# Patient Record
Sex: Male | Born: 1970 | Race: Black or African American | Hispanic: No | Marital: Married | Smoking: Current some day smoker
Health system: Southern US, Community
[De-identification: ages and names within clinical notes are randomized; demographics above are authoritative.]

## PROBLEM LIST (undated history)

## (undated) DIAGNOSIS — M5126 Other intervertebral disc displacement, lumbar region: Secondary | ICD-10-CM

## (undated) DIAGNOSIS — M549 Dorsalgia, unspecified: Secondary | ICD-10-CM

## (undated) DIAGNOSIS — I10 Essential (primary) hypertension: Secondary | ICD-10-CM

---

## 1998-12-22 ENCOUNTER — Emergency Department (HOSPITAL_COMMUNITY): Admission: EM | Admit: 1998-12-22 | Discharge: 1998-12-22 | Payer: Self-pay | Admitting: Emergency Medicine

## 1999-08-05 ENCOUNTER — Encounter: Payer: Self-pay | Admitting: Emergency Medicine

## 1999-08-05 ENCOUNTER — Emergency Department (HOSPITAL_COMMUNITY): Admission: EM | Admit: 1999-08-05 | Discharge: 1999-08-05 | Payer: Self-pay | Admitting: Emergency Medicine

## 2001-01-28 ENCOUNTER — Emergency Department (HOSPITAL_COMMUNITY): Admission: EM | Admit: 2001-01-28 | Discharge: 2001-01-28 | Payer: Self-pay | Admitting: Emergency Medicine

## 2004-12-03 ENCOUNTER — Emergency Department (HOSPITAL_COMMUNITY): Admission: EM | Admit: 2004-12-03 | Discharge: 2004-12-03 | Payer: Self-pay | Admitting: Emergency Medicine

## 2010-12-11 ENCOUNTER — Emergency Department (HOSPITAL_COMMUNITY)
Admission: EM | Admit: 2010-12-11 | Discharge: 2010-12-12 | Disposition: A | Discharge status: Home / Self Care | Payer: Self-pay | Attending: Emergency Medicine | Admitting: Emergency Medicine

## 2010-12-11 DIAGNOSIS — M25569 Pain in unspecified knee: Secondary | ICD-10-CM | POA: Insufficient documentation

## 2010-12-26 ENCOUNTER — Emergency Department (HOSPITAL_COMMUNITY): Payer: Self-pay

## 2010-12-26 ENCOUNTER — Emergency Department (HOSPITAL_COMMUNITY)
Admission: EM | Admit: 2010-12-26 | Discharge: 2010-12-26 | Disposition: A | Discharge status: Home / Self Care | Payer: Self-pay | Attending: Emergency Medicine | Admitting: Emergency Medicine

## 2010-12-26 DIAGNOSIS — M25569 Pain in unspecified knee: Secondary | ICD-10-CM | POA: Insufficient documentation

## 2010-12-26 DIAGNOSIS — M25469 Effusion, unspecified knee: Secondary | ICD-10-CM | POA: Insufficient documentation

## 2010-12-26 DIAGNOSIS — R209 Unspecified disturbances of skin sensation: Secondary | ICD-10-CM | POA: Insufficient documentation

## 2012-09-12 ENCOUNTER — Emergency Department (HOSPITAL_COMMUNITY)
Admission: EM | Admit: 2012-09-12 | Discharge: 2012-09-12 | Disposition: A | Discharge status: Home / Self Care | Payer: Self-pay | Attending: Emergency Medicine | Admitting: Emergency Medicine

## 2012-09-12 ENCOUNTER — Encounter (HOSPITAL_COMMUNITY): Payer: Self-pay | Admitting: *Deleted

## 2012-09-12 DIAGNOSIS — M545 Low back pain, unspecified: Secondary | ICD-10-CM | POA: Insufficient documentation

## 2012-09-12 DIAGNOSIS — M5430 Sciatica, unspecified side: Secondary | ICD-10-CM | POA: Insufficient documentation

## 2012-09-12 DIAGNOSIS — G8929 Other chronic pain: Secondary | ICD-10-CM | POA: Insufficient documentation

## 2012-09-12 DIAGNOSIS — Z8739 Personal history of other diseases of the musculoskeletal system and connective tissue: Secondary | ICD-10-CM | POA: Insufficient documentation

## 2012-09-12 HISTORY — DX: Dorsalgia, unspecified: M54.9

## 2012-09-12 HISTORY — DX: Other intervertebral disc displacement, lumbar region: M51.26

## 2012-09-12 MED ORDER — DIAZEPAM 5 MG PO TABS
10.0000 mg | ORAL_TABLET | Freq: Once | ORAL | Status: AC
  Administered 2012-09-12: 10 mg via ORAL
  Filled 2012-09-12: qty 2

## 2012-09-12 MED ORDER — OXYCODONE-ACETAMINOPHEN 5-325 MG PO TABS
2.0000 | ORAL_TABLET | Freq: Once | ORAL | Status: AC
  Administered 2012-09-12: 2 via ORAL
  Filled 2012-09-12: qty 2

## 2012-09-12 MED ORDER — NAPROXEN 500 MG PO TABS: 500.0000 mg | ORAL_TABLET | Freq: Two times a day (BID) | ORAL | Status: DC | PRN

## 2012-09-12 MED ORDER — METHOCARBAMOL 750 MG PO TABS: 750.0000 mg | ORAL_TABLET | Freq: Four times a day (QID) | ORAL | Status: DC | PRN

## 2012-09-12 MED ORDER — HYDROCODONE-ACETAMINOPHEN 5-325 MG PO TABS: 1.0000 | ORAL_TABLET | Freq: Four times a day (QID) | ORAL | Status: DC | PRN

## 2012-09-12 NOTE — ED Notes (Signed)
Pt reports having lower back pain since Monday. Has hx of back pain, thinks he did too much walking on Monday. Ambulatory at triage.

## 2012-09-12 NOTE — ED Provider Notes (Signed)
History    Scribed for Laray Anger, DO, the patient was seen in room TR05C/TR05C . This chart was scribed by Lewanda Rife.  CSN: [id number]  Arrival date & time 09/12/12  4098   First MD Initiated Contact with Patient 09/12/12 2012      Chief Complaint  Patient presents with  . Back Pain    (Consider location/radiation/quality/duration/timing/severity/associated sxs/prior treatment) HPI Mark Fuller is a 42 y.o. male with a hx of herniated disc and chronic back pain presents to the Emergency Department complaining of constant moderate left lower back pain onset 4 days. Pt reports he has been walking a lot more recently at work and denies recent injury or fall. Pt reports walking, sitting, and bearing weight makes pain worse. Nothing makes it better.  Pt describes pain 7/10 in severity and radiating down left leg to bottom of left foot.  Pt describes pain as burning. Pt denies bowel and bladder incontinence, saddle anesthesia or loss of leg function or sensation. Pt reports trying to stretch, ice, heat, and ibuprofen with no relief. Pt reports cortisone shot from [facility] helped in the past. Pt reports a hx of herniated disc and back pain for which he is treated by an orthopedist at [facility].    Past Medical History  Diagnosis Date  . Back pain   . Lumbar herniated disc     History reviewed. No pertinent past surgical history.  History reviewed. No pertinent family history.  History  Substance Use Topics  . Smoking status: Not on file  . Smokeless tobacco: Not on file  . Alcohol Use: Yes     Comment: occ      Review of Systems  Constitutional: Negative.   HENT: Negative.   Respiratory: Negative.   Cardiovascular: Negative.   Gastrointestinal: Negative.   Musculoskeletal: Positive for back pain. Negative for gait problem.  Skin: Negative.   Neurological: Negative.   Hematological: Negative.   Psychiatric/Behavioral: Negative.      Allergies  Review of patient's allergies indicates no known allergies.  Home Medications   Current Outpatient Rx  Name  Route  Sig  Dispense  Refill  . IBUPROFEN 200 MG PO TABS   Oral   Take 200 mg by mouth every 6 (six) hours as needed. For pain         . HYDROCODONE-ACETAMINOPHEN 5-325 MG PO TABS   Oral   Take 1 tablet by mouth every 6 (six) hours as needed for pain (Take 1 - 2 tablets every 4 - 6 hours.).   20 tablet   0   . METHOCARBAMOL 750 MG PO TABS   Oral   Take 1 tablet (750 mg total) by mouth 4 (four) times daily as needed (Take 1 tablet every 6 hours as needed for muscle spasms.).   20 tablet   0   . NAPROXEN 500 MG PO TABS   Oral   Take 1 tablet (500 mg total) by mouth 2 (two) times daily as needed.   30 tablet   0     BP 142/90  Pulse 92  Temp 98.4 F (36.9 C) (Oral)  Resp 16  SpO2 97%  Physical Exam  Nursing note and vitals reviewed. Constitutional: He is oriented to person, place, and time. He appears well-developed and well-nourished. No distress.  HENT:  Head: Normocephalic and atraumatic.  Mouth/Throat: Oropharynx is clear and moist. No oropharyngeal exudate.  Eyes: Conjunctivae normal are normal. Pupils are equal, round, and reactive  to light. No scleral icterus.  Neck: Normal range of motion. Neck supple.       Full ROM without pain  Cardiovascular: Normal rate, regular rhythm, normal heart sounds and intact distal pulses.  Exam reveals no gallop and no friction rub.   No murmur heard. Pulmonary/Chest: Effort normal and breath sounds normal. No respiratory distress. He has no wheezes.  Abdominal: Soft. Bowel sounds are normal. He exhibits no mass. There is no tenderness. There is no rebound and no guarding.  Musculoskeletal: Normal range of motion. He exhibits no edema.       Lumbar back: He exhibits tenderness and pain. He exhibits no bony tenderness, no swelling, no edema, no deformity, no laceration and no spasm.       Decreased ROM  when bending secondary to pain Full ROM in all other joints Pain to palpation of the paraspinal muscles in the L spine, L > R No pain to palpation of the spinous processes in the cervical, thoracic or lumbar spine  Lymphadenopathy:    He has no cervical adenopathy.  Neurological: He is alert and oriented to person, place, and time. He has normal strength and normal reflexes. No cranial nerve deficit or sensory deficit. He exhibits normal muscle tone. GCS eye subscore is 4. GCS verbal subscore is 5. GCS motor subscore is 6.  Reflex Scores:      Patellar reflexes are 2+ on the right side and 2+ on the left side.      Achilles reflexes are 2+ on the right side and 2+ on the left side.      Speech is clear and goal oriented, follows commands Normal strength 5/5 in upper and lower extremities bilaterally including dorsiflexion and plantar flexion, strong and equal grip strength Sensation normal to light and sharp touch Moves extremities without ataxia, coordination intact Normal gait and ambulates well Normal balance   Skin: Skin is warm and dry. No rash noted. He is not diaphoretic. No erythema.  Psychiatric: He has a normal mood and affect.    ED Course  Procedures (including critical care time)  Labs Reviewed - No data to display No results found.   1. Low back pain   2. Sciatica       MDM  Mark Fuller presents with nontraumatic back pain consistent with sciatica.  Pt reproducible with palpation of the gluteus maximus and with bending.  Patient with Hx of chronic back pain an acute exacerbation todau.  No neurological deficits and normal neuro exam.  Patient can walk but states is painful.  No loss of bowel or bladder control.  No concern for cauda equina.  No imaging indicated at this time.  No fever, night sweats, weight loss, h/o cancer, IVDU.  RICE protocol and pain medicine indicated and discussed with patient.   1. Medications: robaxin, vicodin, naprosyn, usual home  medications 2. Treatment: rest, drink plenty of fluids, gentle stretching as discussed, alternate ice and heat 3. Follow Up: Please followup with your primary doctor for discussion of your diagnoses and further evaluation after today's visit; if you do not have a primary care doctor use the resource guide provided to find one; f/u with [facility]  I personally performed the services described in this documentation, which was scribed in my presence. The recorded information has been reviewed and is accurate.   Dahlia Client Verlena Marlette, PA-C 09/12/12 2251

## 2012-09-13 NOTE — ED Provider Notes (Signed)
Medical screening examination/treatment/procedure(s) were performed by non-physician practitioner and as supervising physician I was immediately available for consultation/collaboration.   Laray Anger, DO 09/13/12 1559

## 2013-08-18 ENCOUNTER — Encounter (HOSPITAL_COMMUNITY): Payer: Self-pay | Admitting: Emergency Medicine

## 2013-08-18 ENCOUNTER — Emergency Department (HOSPITAL_COMMUNITY)
Admission: EM | Admit: 2013-08-18 | Discharge: 2013-08-18 | Disposition: A | Discharge status: Home / Self Care | Payer: Self-pay | Attending: Emergency Medicine | Admitting: Emergency Medicine

## 2013-08-18 ENCOUNTER — Emergency Department (HOSPITAL_COMMUNITY): Payer: Self-pay

## 2013-08-18 DIAGNOSIS — M25512 Pain in left shoulder: Secondary | ICD-10-CM

## 2013-08-18 DIAGNOSIS — R52 Pain, unspecified: Secondary | ICD-10-CM | POA: Insufficient documentation

## 2013-08-18 DIAGNOSIS — M25519 Pain in unspecified shoulder: Secondary | ICD-10-CM | POA: Insufficient documentation

## 2013-08-18 DIAGNOSIS — F17200 Nicotine dependence, unspecified, uncomplicated: Secondary | ICD-10-CM | POA: Insufficient documentation

## 2013-08-18 MED ORDER — HYDROCODONE-ACETAMINOPHEN 5-325 MG PO TABS
1.0000 | ORAL_TABLET | Freq: Once | ORAL | Status: AC
  Administered 2013-08-18: 1 via ORAL
  Filled 2013-08-18: qty 1

## 2013-08-18 MED ORDER — NAPROXEN 500 MG PO TABS: 500.0000 mg | ORAL_TABLET | Freq: Two times a day (BID) | ORAL | Status: DC

## 2013-08-18 MED ORDER — HYDROCODONE-ACETAMINOPHEN 5-325 MG PO TABS: ORAL_TABLET | ORAL | Status: DC

## 2013-08-18 NOTE — ED Provider Notes (Signed)
Medical screening examination/treatment/procedure(s) were performed by non-physician practitioner and as supervising physician I was immediately available for consultation/collaboration.  EKG Interpretation   None        Ethelda Chick, MD 08/18/13 1217

## 2013-08-18 NOTE — ED Notes (Signed)
Per pt, left shoulder pain for a year-progressively getting worse-has not seen  Ortho-thinks it is related to job

## 2013-08-18 NOTE — ED Provider Notes (Signed)
CSN: [id number]     Arrival date & time 08/18/13  1022 History   First MD Initiated Contact with Patient 08/18/13 1100     Chief Complaint  Patient presents with  . Shoulder Pain   (Consider location/radiation/quality/duration/timing/severity/associated sxs/prior Treatment) HPI Comments: Patient presents with progressively worsening left shoulder pain for the past one year. Patient states that the pain began as a mild pain, relieved with Goody powder. Over the past several months to become more severe and makes it hard for him to get dressed. Pain is currently 8/10. Patient is a [employer] and does a lot of loading and unloading, heavy lifting. He denies acute injury. No numbness or tingling in his arm. The onset of this condition was gradual. Aggravating factors: certain movements. Alleviating factors: none.    Patient is a 42 y.o. male presenting with shoulder pain. The history is provided by the patient.  Shoulder Pain Associated symptoms include arthralgias and myalgias. Pertinent negatives include no joint swelling, neck pain, numbness or weakness.    Past Medical History  Diagnosis Date  . Back pain   . Lumbar herniated disc    History reviewed. No pertinent past surgical history. No family history on file. History  Substance Use Topics  . Smoking status: Current Some Day Smoker    Types: Cigarettes  . Smokeless tobacco: Not on file  . Alcohol Use: Yes     Comment: occ    Review of Systems  Constitutional: Negative for activity change.  Musculoskeletal: Positive for arthralgias and myalgias. Negative for back pain, gait problem, joint swelling and neck pain.  Skin: Negative for wound.  Neurological: Negative for weakness and numbness.    Allergies  Review of patient's allergies indicates no known allergies.  Home Medications   Current Outpatient Rx  Name  Route  Sig  Dispense  Refill  . Aspirin-Salicylamide-Caffeine (ARTHRITIS STRENGTH BC POWDER PO)   Oral  Take 1 each by mouth daily as needed (pain).         Marland Kitchen ibuprofen (ADVIL,MOTRIN) 200 MG tablet   Oral   Take 800 mg by mouth every 6 (six) hours as needed for mild pain or moderate pain.         Marland Kitchen HYDROcodone-acetaminophen (NORCO/VICODIN) 5-325 MG per tablet   Oral   Take 1 tablet by mouth every 6 (six) hours as needed for moderate pain (Take 1 - 2 tablets every 4 - 6 hours.).          BP 132/86  Pulse 79  Temp(Src) 98 F (36.7 C) (Oral)  Resp 18  SpO2 100% Physical Exam  Nursing note and vitals reviewed. Constitutional: He appears well-developed and well-nourished.  HENT:  Head: Normocephalic and atraumatic.  Eyes: Conjunctivae are normal.  Neck: Normal range of motion. Neck supple.  Cardiovascular: Normal pulses.   Musculoskeletal: He exhibits tenderness. He exhibits no edema.       Left shoulder: He exhibits tenderness. He exhibits normal range of motion, no bony tenderness and no deformity.       Left elbow: Normal.       Cervical back: Normal.  Tenderness over upper anterior chest wall, lateral deltoid, AC joint. No tenderness posteriorly. No skin changes. 5/5 strength in all directions with pain. Full active ROM but patient has difficulty lifting arm above shoulder height and placing hand behind back.   Neurological: He is alert. No sensory deficit.  Motor, sensation, and vascular distal to the injury is fully intact.   Skin:  Skin is warm and dry.  Psychiatric: He has a normal mood and affect.    ED Course  Procedures (including critical care time) Labs Review Labs Reviewed - No data to display Imaging Review No results found.  EKG Interpretation   None      11:15 AM Patient seen and examined. Work-up initiated. Medications ordered.   Vital signs reviewed and are as follows: Filed Vitals:   08/18/13 1047  BP: 132/86  Pulse: 79  Temp: 98 F (36.7 C)  Resp: 18   12:09 PM patient informed of x-ray results. Sling given. Orthopedic referral given. He  will continue pain medication and NSAIDs.  Patient counseled on use of narcotic pain medications. Counseled not to combine these medications with others containing tylenol. Urged not to drink alcohol, drive, or perform any other activities that requires focus while taking these medications. The patient verbalizes understanding and agrees with the plan.   MDM   1. Shoulder pain, acute, left    Patient with possible rotator cuff injury left shoulder, likely associated with overuse. X-ray is negative. He will need orthopedic followup for further evaluation. Upper extremity is neurovascularly intact.    Renne Crigler, PA-C 08/18/13 1210

## 2013-09-22 ENCOUNTER — Emergency Department (INDEPENDENT_AMBULATORY_CARE_PROVIDER_SITE_OTHER)
Admission: EM | Admit: 2013-09-22 | Discharge: 2013-09-22 | Disposition: A | Discharge status: Home / Self Care | Payer: 59 | Source: Home / Self Care | Attending: Family Medicine | Admitting: Family Medicine

## 2013-09-22 ENCOUNTER — Encounter (HOSPITAL_COMMUNITY): Payer: Self-pay | Admitting: Emergency Medicine

## 2013-09-22 DIAGNOSIS — M7520 Bicipital tendinitis, unspecified shoulder: Secondary | ICD-10-CM

## 2013-09-22 DIAGNOSIS — M7522 Bicipital tendinitis, left shoulder: Secondary | ICD-10-CM

## 2013-09-22 MED ORDER — METHYLPREDNISOLONE ACETATE 80 MG/ML IJ SUSP
INTRAMUSCULAR | Status: AC
  Filled 2013-09-22: qty 1

## 2013-09-22 MED ORDER — METHYLPREDNISOLONE ACETATE 40 MG/ML IJ SUSP
80.0000 mg | Freq: Once | INTRAMUSCULAR | Status: AC
  Administered 2013-09-22: 80 mg via INTRAMUSCULAR

## 2013-09-22 MED ORDER — DICLOFENAC SODIUM 1 % TD GEL: 4.0000 g | Freq: Four times a day (QID) | TRANSDERMAL | Status: DC

## 2013-09-22 NOTE — ED Notes (Signed)
Pt  Reports      Pain l  Shoulder         X    sev  Months       denys   Any  Injury          Pain is  Worse  On  Movement  And  Certain posistions            Worse    Over the  Last  Month

## 2013-09-22 NOTE — ED Provider Notes (Signed)
CSN: [id number]     Arrival date & time 09/22/13  1249 History   First MD Initiated Contact with Patient 09/22/13 1321     Chief Complaint  Patient presents with  . Shoulder Pain   (Consider location/radiation/quality/duration/timing/severity/associated sxs/prior Treatment) Patient is a 43 y.o. male presenting with shoulder pain. The history is provided by the patient and the spouse.  Shoulder Pain This is a new problem. 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 The problem has not changed since onset.Pertinent negatives include no chest pain, no abdominal pain and no headaches.    Past Medical History  Diagnosis Date  . Back pain   . Lumbar herniated disc    History reviewed. No pertinent past surgical history. History reviewed. No pertinent family history. History  Substance Use Topics  . Smoking status: Current Some Day Smoker    Types: Cigarettes  . Smokeless tobacco: Not on file  . Alcohol Use: Yes     Comment: occ    Review of Systems  Constitutional: Negative.   Cardiovascular: Negative for chest pain.  Gastrointestinal: Negative.  Negative for abdominal pain.  Musculoskeletal: Negative for back pain, joint swelling, myalgias and neck pain.  Skin: Negative.   Neurological: Negative for headaches.    Allergies  Review of patient's allergies indicates no known allergies.  Home Medications   Current Outpatient Rx  Name  Route  Sig  Dispense  Refill  . Aspirin-Salicylamide-Caffeine (ARTHRITIS STRENGTH BC POWDER PO)   Oral   Take 1 each by mouth daily as needed (pain).         Marland Kitchen. diclofenac sodium (VOLTAREN) 1 % GEL   Topical   Apply 4 g topically 4 (four) times daily. To left shoulder   3 Tube   1   . HYDROcodone-acetaminophen (NORCO/VICODIN) 5-325 MG per tablet      Take 1-2 tablets every 6 hours as needed for severe pain   12 tablet   0   . ibuprofen (ADVIL,MOTRIN) 200 MG tablet  Oral   Take 800 mg by mouth every 6 (six) hours as needed for mild pain or moderate pain.         . naproxen (NAPROSYN) 500 MG tablet   Oral   Take 1 tablet (500 mg total) by mouth 2 (two) times daily.   20 tablet   0    BP 128/89  Pulse 66  Temp(Src) 97.8 F (36.6 C) (Oral)  Resp 18  SpO2 100% Physical Exam  Nursing note and vitals reviewed. Constitutional: He is oriented to person, place, and time. He appears well-developed and well-nourished.  Neck: Normal range of motion. Neck supple.  Musculoskeletal: He exhibits tenderness.       Left shoulder: He exhibits decreased range of motion and tenderness. He exhibits no bony tenderness, no swelling, no crepitus, normal pulse and normal strength.       Arms: Lymphadenopathy:    He has no cervical adenopathy.  Neurological: He is alert and oriented to person, place, and time.  Skin: Skin is warm and dry.    ED Course  Procedures (including critical care time) Labs Review Labs Reviewed - No data to display Imaging Review No results found.    MDM      Linna HoffJames D Kindl, MD 09/22/13 [phone]

## 2013-09-22 NOTE — Discharge Instructions (Signed)
Ice, sling and medicine as prescribed, see orthopedist if further problems °

## 2014-04-15 ENCOUNTER — Emergency Department (HOSPITAL_COMMUNITY)
Admission: EM | Admit: 2014-04-15 | Discharge: 2014-04-15 | Disposition: A | Discharge status: Home / Self Care | Payer: 59 | Attending: Family Medicine | Admitting: Family Medicine

## 2014-04-15 ENCOUNTER — Emergency Department (HOSPITAL_COMMUNITY): Payer: 59

## 2014-04-15 ENCOUNTER — Encounter (HOSPITAL_COMMUNITY): Payer: Self-pay | Admitting: Emergency Medicine

## 2014-04-15 DIAGNOSIS — Z791 Long term (current) use of non-steroidal anti-inflammatories (NSAID): Secondary | ICD-10-CM | POA: Insufficient documentation

## 2014-04-15 DIAGNOSIS — M549 Dorsalgia, unspecified: Secondary | ICD-10-CM | POA: Insufficient documentation

## 2014-04-15 DIAGNOSIS — S161XXA Strain of muscle, fascia and tendon at neck level, initial encounter: Secondary | ICD-10-CM

## 2014-04-15 DIAGNOSIS — S4980XA Other specified injuries of shoulder and upper arm, unspecified arm, initial encounter: Secondary | ICD-10-CM | POA: Insufficient documentation

## 2014-04-15 DIAGNOSIS — W19XXXA Unspecified fall, initial encounter: Secondary | ICD-10-CM | POA: Insufficient documentation

## 2014-04-15 DIAGNOSIS — S0993XA Unspecified injury of face, initial encounter: Secondary | ICD-10-CM | POA: Insufficient documentation

## 2014-04-15 DIAGNOSIS — Z7982 Long term (current) use of aspirin: Secondary | ICD-10-CM | POA: Insufficient documentation

## 2014-04-15 DIAGNOSIS — S199XXA Unspecified injury of neck, initial encounter: Secondary | ICD-10-CM

## 2014-04-15 DIAGNOSIS — S139XXA Sprain of joints and ligaments of unspecified parts of neck, initial encounter: Secondary | ICD-10-CM | POA: Insufficient documentation

## 2014-04-15 DIAGNOSIS — S46909A Unspecified injury of unspecified muscle, fascia and tendon at shoulder and upper arm level, unspecified arm, initial encounter: Secondary | ICD-10-CM | POA: Insufficient documentation

## 2014-04-15 DIAGNOSIS — F17200 Nicotine dependence, unspecified, uncomplicated: Secondary | ICD-10-CM | POA: Insufficient documentation

## 2014-04-15 DIAGNOSIS — Y939 Activity, unspecified: Secondary | ICD-10-CM | POA: Insufficient documentation

## 2014-04-15 DIAGNOSIS — Y929 Unspecified place or not applicable: Secondary | ICD-10-CM | POA: Insufficient documentation

## 2014-04-15 MED ORDER — METAXALONE 800 MG PO TABS: 800.0000 mg | ORAL_TABLET | Freq: Three times a day (TID) | ORAL | Status: DC

## 2014-04-15 MED ORDER — IBUPROFEN 800 MG PO TABS: 800.0000 mg | ORAL_TABLET | Freq: Three times a day (TID) | ORAL | Status: DC

## 2014-04-15 NOTE — ED Provider Notes (Signed)
CSN: [id number]     Arrival date & time 04/15/14  0807 History   First MD Initiated Contact with Patient 04/15/14 [phone]     Chief Complaint  Patient presents with  . Neck Pain  . Shoulder Pain     (Consider location/radiation/quality/duration/timing/severity/associated sxs/prior Treatment) HPI  Past Medical History  Diagnosis Date  . Back pain   . Lumbar herniated disc    History reviewed. No pertinent past surgical history. No family history on file. History  Substance Use Topics  . Smoking status: Current Some Day Smoker    Types: Cigarettes  . Smokeless tobacco: Not on file  . Alcohol Use: Yes     Comment: occ    Review of Systems    Allergies  Review of patient's allergies indicates no known allergies.  Home Medications   Prior to Admission medications   Medication Sig Start Date End Date Taking? Authorizing Provider  Aspirin-Salicylamide-Caffeine (ARTHRITIS STRENGTH BC POWDER PO) Take 1 each by mouth daily as needed (pain).    Historical Provider, MD  diclofenac sodium (VOLTAREN) 1 % GEL Apply 4 g topically 4 (four) times daily. To left shoulder 09/22/13   Linna Hoff, MD  HYDROcodone-acetaminophen (NORCO/VICODIN) 5-325 MG per tablet Take 1-2 tablets every 6 hours as needed for severe pain 08/18/13   Renne Crigler, PA-C  ibuprofen (ADVIL,MOTRIN) 200 MG tablet Take 800 mg by mouth every 6 (six) hours as needed for mild pain or moderate pain.    Historical Provider, MD  ibuprofen (ADVIL,MOTRIN) 800 MG tablet Take 1 tablet (800 mg total) by mouth 3 (three) times daily. For neck pain 04/15/14   Linna Hoff, MD  metaxalone (SKELAXIN) 800 MG tablet Take 1 tablet (800 mg total) by mouth 3 (three) times daily. Muscle relaxer 04/15/14   Linna Hoff, MD  naproxen (NAPROSYN) 500 MG tablet Take 1 tablet (500 mg total) by mouth 2 (two) times daily. 08/18/13   Renne Crigler, PA-C   BP 143/89  Pulse 67  Temp(Src) 97.4 F (36.3 C) (Oral)  Resp 18  SpO2 100% Physical  Exam  ED Course  Procedures (including critical care time) Labs Review Labs Reviewed - No data to display  Imaging Review Dg Cervical Spine Complete  04/15/2014   CLINICAL DATA:  Right neck/arm pain  EXAM: CERVICAL SPINE  4+ VIEWS  COMPARISON:  None.  FINDINGS: Cervical spine is visualized to C7-T1 on the lateral view.  Mild reversal of the normal cervical lordosis.  No evidence of fracture or dislocation. Vertebral body heights are maintained. Dens appears intact.  Mild degenerative changes, most prominent at C5-6. Bilateral neural foramina are patent.  Visualized lung apices are clear.  IMPRESSION: No fracture or dislocation is seen.  Mild degenerative changes.   Electronically Signed   By: Charline Bills M.D.   On: 04/15/2014 09:00   X-rays reviewed and report per radiologist.   EKG Interpretation None      MDM   Final diagnoses:  Cervical muscle strain, initial encounter        Linna Hoff, MD 04/15/14 [phone]

## 2014-04-15 NOTE — Discharge Instructions (Signed)
Heat on neck and medicine as needed, return as needed.

## 2014-04-15 NOTE — ED Provider Notes (Signed)
CSN: [id number]     Arrival date & time 04/15/14  0807 History   First MD Initiated Contact with Patient 04/15/14 [phone]     Chief Complaint  Patient presents with  . Neck Pain  . Shoulder Pain     (Consider location/radiation/quality/duration/timing/severity/associated sxs/prior Treatment) Patient is a 43 y.o. male presenting with neck pain. The history is provided by the patient.  Neck Pain Pain location:  R side Quality:  Shooting Pain radiates to:  R scapula and R shoulder Pain severity:  Moderate Onset quality:  Gradual Duration:  1 month Timing:  Constant Chronicity:  New Context: not lifting a heavy object and not recent injury   Relieved by:  Nothing Worsened by:  Nothing tried Associated symptoms: no chest pain, no fever, no leg pain, no numbness, no paresis and no weakness     Past Medical History  Diagnosis Date  . Back pain   . Lumbar herniated disc    History reviewed. No pertinent past surgical history. No family history on file. History  Substance Use Topics  . Smoking status: Current Some Day Smoker    Types: Cigarettes  . Smokeless tobacco: Not on file  . Alcohol Use: Yes     Comment: occ    Review of Systems  Constitutional: Negative.  Negative for fever.  Cardiovascular: Negative for chest pain.  Musculoskeletal: Positive for neck pain. Negative for back pain and neck stiffness.  Skin: Negative.   Neurological: Negative for weakness and numbness.      Allergies  Review of patient's allergies indicates no known allergies.  Home Medications   Prior to Admission medications   Medication Sig Start Date End Date Taking? Authorizing Provider  Aspirin-Salicylamide-Caffeine (ARTHRITIS STRENGTH BC POWDER PO) Take 1 each by mouth daily as needed (pain).    Historical Provider, MD  diclofenac sodium (VOLTAREN) 1 % GEL Apply 4 g topically 4 (four) times daily. To left shoulder 09/22/13   Linna Hoff, MD  HYDROcodone-acetaminophen (NORCO/VICODIN)  5-325 MG per tablet Take 1-2 tablets every 6 hours as needed for severe pain 08/18/13   Renne Crigler, PA-C  ibuprofen (ADVIL,MOTRIN) 200 MG tablet Take 800 mg by mouth every 6 (six) hours as needed for mild pain or moderate pain.    Historical Provider, MD  ibuprofen (ADVIL,MOTRIN) 800 MG tablet Take 1 tablet (800 mg total) by mouth 3 (three) times daily. For neck pain 04/15/14   Linna Hoff, MD  metaxalone (SKELAXIN) 800 MG tablet Take 1 tablet (800 mg total) by mouth 3 (three) times daily. Muscle relaxer 04/15/14   Linna Hoff, MD  naproxen (NAPROSYN) 500 MG tablet Take 1 tablet (500 mg total) by mouth 2 (two) times daily. 08/18/13   Renne Crigler, PA-C   BP 143/89  Pulse 67  Temp(Src) 97.4 F (36.3 C) (Oral)  Resp 18  SpO2 100% Physical Exam  Nursing note and vitals reviewed. Constitutional: He is oriented to person, place, and time. He appears well-developed and well-nourished. He appears distressed.  Neck: Trachea normal. Neck supple. Muscular tenderness present. No spinous process tenderness present. No rigidity. Decreased range of motion present. No thyromegaly present.    Neurological: He is alert and oriented to person, place, and time.  Skin: Skin is warm and dry.    ED Course  Procedures (including critical care time) Labs Review Labs Reviewed - No data to display  Imaging Review No results found.   EKG Interpretation None      MDM  Final diagnoses:  Cervical muscle strain, initial encounter        Linna Hoff, MD 04/22/14 1250

## 2014-04-15 NOTE — ED Notes (Signed)
Patient states he has been having R neck and R shoulder pain x 1 month.   Patient states he has been taking tylenol and bc powders with no relief.  Patient denies other symptoms.

## 2014-04-16 NOTE — ED Provider Notes (Signed)
CSN: [id number]     Arrival date & time 04/15/14  0807 History   First MD Initiated Contact with Patient 04/15/14 [phone]     Chief Complaint  Patient presents with  . Neck Pain  . Shoulder Pain     (Consider location/radiation/quality/duration/timing/severity/associated sxs/prior Treatment) HPI  Past Medical History  Diagnosis Date  . Back pain   . Lumbar herniated disc    History reviewed. No pertinent past surgical history. No family history on file. History  Substance Use Topics  . Smoking status: Current Some Day Smoker    Types: Cigarettes  . Smokeless tobacco: Not on file  . Alcohol Use: Yes     Comment: occ    Review of Systems    Allergies  Review of patient's allergies indicates no known allergies.  Home Medications   Prior to Admission medications   Medication Sig Start Date End Date Taking? Authorizing Provider  Aspirin-Salicylamide-Caffeine (ARTHRITIS STRENGTH BC POWDER PO) Take 1 each by mouth daily as needed (pain).    Historical Provider, MD  diclofenac sodium (VOLTAREN) 1 % GEL Apply 4 g topically 4 (four) times daily. To left shoulder 09/22/13   Linna Hoff, MD  HYDROcodone-acetaminophen (NORCO/VICODIN) 5-325 MG per tablet Take 1-2 tablets every 6 hours as needed for severe pain 08/18/13   Renne Crigler, PA-C  ibuprofen (ADVIL,MOTRIN) 200 MG tablet Take 800 mg by mouth every 6 (six) hours as needed for mild pain or moderate pain.    Historical Provider, MD  ibuprofen (ADVIL,MOTRIN) 800 MG tablet Take 1 tablet (800 mg total) by mouth 3 (three) times daily. For neck pain 04/15/14   Linna Hoff, MD  metaxalone (SKELAXIN) 800 MG tablet Take 1 tablet (800 mg total) by mouth 3 (three) times daily. Muscle relaxer 04/15/14   Linna Hoff, MD  naproxen (NAPROSYN) 500 MG tablet Take 1 tablet (500 mg total) by mouth 2 (two) times daily. 08/18/13   Renne Crigler, PA-C   BP 143/89  Pulse 67  Temp(Src) 97.4 F (36.3 C) (Oral)  Resp 18  SpO2 100% Physical  Exam  ED Course  Procedures (including critical care time) Labs Review Labs Reviewed - No data to display  Imaging Review Dg Cervical Spine Complete  04/15/2014   CLINICAL DATA:  Right neck/arm pain  EXAM: CERVICAL SPINE  4+ VIEWS  COMPARISON:  None.  FINDINGS: Cervical spine is visualized to C7-T1 on the lateral view.  Mild reversal of the normal cervical lordosis.  No evidence of fracture or dislocation. Vertebral body heights are maintained. Dens appears intact.  Mild degenerative changes, most prominent at C5-6. Bilateral neural foramina are patent.  Visualized lung apices are clear.  IMPRESSION: No fracture or dislocation is seen.  Mild degenerative changes.   Electronically Signed   By: Charline Bills M.D.   On: 04/15/2014 09:00     EKG Interpretation None      MDM   Final diagnoses:  Cervical muscle strain, initial encounter        Linna Hoff, MD 04/16/14 [phone]

## 2015-01-14 ENCOUNTER — Emergency Department (HOSPITAL_COMMUNITY): Payer: Worker's Compensation

## 2015-01-14 ENCOUNTER — Encounter (HOSPITAL_COMMUNITY): Payer: Self-pay | Admitting: Emergency Medicine

## 2015-01-14 ENCOUNTER — Emergency Department (HOSPITAL_COMMUNITY)
Admission: EM | Admit: 2015-01-14 | Discharge: 2015-01-15 | Disposition: A | Discharge status: Home / Self Care | Payer: Worker's Compensation | Attending: Emergency Medicine | Admitting: Emergency Medicine

## 2015-01-14 DIAGNOSIS — R0789 Other chest pain: Secondary | ICD-10-CM

## 2015-01-14 DIAGNOSIS — Z8739 Personal history of other diseases of the musculoskeletal system and connective tissue: Secondary | ICD-10-CM | POA: Diagnosis not present

## 2015-01-14 DIAGNOSIS — Z720 Tobacco use: Secondary | ICD-10-CM | POA: Diagnosis not present

## 2015-01-14 DIAGNOSIS — Z791 Long term (current) use of non-steroidal anti-inflammatories (NSAID): Secondary | ICD-10-CM | POA: Diagnosis not present

## 2015-01-14 DIAGNOSIS — R079 Chest pain, unspecified: Secondary | ICD-10-CM

## 2015-01-14 LAB — I-STAT TROPONIN, ED: Troponin i, poc: 0 ng/mL (ref 0.00–0.08)

## 2015-01-14 LAB — BASIC METABOLIC PANEL
ANION GAP: 8 (ref 5–15)
BUN: 10 mg/dL (ref 6–20)
CALCIUM: 9 mg/dL (ref 8.9–10.3)
CO2: 24 mmol/L (ref 22–32)
CREATININE: 1.11 mg/dL (ref 0.61–1.24)
Chloride: 107 mmol/L (ref 101–111)
GLUCOSE: 92 mg/dL (ref 65–99)
POTASSIUM: 3.8 mmol/L (ref 3.5–5.1)
Sodium: 139 mmol/L (ref 135–145)

## 2015-01-14 LAB — CBC
HEMATOCRIT: 42.4 % (ref 39.0–52.0)
HEMOGLOBIN: 13.6 g/dL (ref 13.0–17.0)
MCH: 27.6 pg (ref 26.0–34.0)
MCHC: 32.1 g/dL (ref 30.0–36.0)
MCV: 86.2 fL (ref 78.0–100.0)
Platelets: 291 10*3/uL (ref 150–400)
RBC: 4.92 MIL/uL (ref 4.22–5.81)
RDW: 14.2 % (ref 11.5–15.5)
WBC: 4.3 10*3/uL (ref 4.0–10.5)

## 2015-01-14 MED ORDER — OXYCODONE-ACETAMINOPHEN 5-325 MG PO TABS
1.0000 | ORAL_TABLET | Freq: Once | ORAL | Status: AC
  Administered 2015-01-15: 1 via ORAL
  Filled 2015-01-14: qty 1

## 2015-01-14 MED ORDER — KETOROLAC TROMETHAMINE 60 MG/2ML IM SOLN
60.0000 mg | Freq: Once | INTRAMUSCULAR | Status: AC
  Administered 2015-01-15: 60 mg via INTRAMUSCULAR
  Filled 2015-01-14: qty 2

## 2015-01-14 NOTE — ED Provider Notes (Signed)
CSN: [id number]     Arrival date & time 01/14/15  2209 History  This chart was scribed for Mark Baton, MD by Bronson Curb, ED Scribe. This patient was seen in room D34C/D34C and the patient's care was started at 11:47 PM.   Chief Complaint  Patient presents with  . Chest Pain     The history is provided by the patient. No language interpreter was used.     HPI Comments: Mark Fuller is a 44 y.o. male who presents to the Emergency Department complaining of sudden onset, sharp, 7/10, left chest pain that began approxiamtely 3 hours ago. 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 Patient reports the pain is exacerbated by movement. He has not taken anything for symptom relief. He denies SOB, nausea, vomiting, diaphoresis, or any other symptoms at this time. He denies history of DM and is currently not on any medications for chronic health conditions. Patient is an occasional smoker with no significant cardiac history.   Past Medical History  Diagnosis Date  . Back pain   . Lumbar herniated disc    History reviewed. No pertinent past surgical history. No family history on file. History  Substance Use Topics  . Smoking status: Current Some Day Smoker    Types: Cigarettes  . Smokeless tobacco: Not on file  . Alcohol Use: Yes     Comment: occ    Review of Systems  Constitutional: Negative.  Negative for fever.  Respiratory: Positive for chest tightness. Negative for shortness of breath.   Cardiovascular: Positive for chest pain. Negative for leg swelling.  Gastrointestinal: Negative.  Negative for abdominal pain.  Genitourinary: Negative.   Skin: Negative for wound.  All other systems reviewed and are negative.     Allergies  Review of patient's allergies indicates no known allergies.  Home Medications   Prior to Admission medications   Medication Sig Start Date End Date Taking?  Authorizing Provider  Aspirin-Salicylamide-Caffeine (ARTHRITIS STRENGTH BC POWDER PO) Take 1 each by mouth daily as needed (pain).    Historical Provider, MD  diclofenac sodium (VOLTAREN) 1 % GEL Apply 4 g topically 4 (four) times daily. To left shoulder 09/22/13   Linna Hoff, MD  HYDROcodone-acetaminophen (NORCO/VICODIN) 5-325 MG per tablet Take 1-2 tablets every 6 hours as needed for severe pain 08/18/13   Renne Crigler, PA-C  ibuprofen (ADVIL,MOTRIN) 600 MG tablet Take 1 tablet (600 mg total) by mouth every 6 (six) hours as needed. 01/15/15   Mark Baton, MD  metaxalone (SKELAXIN) 800 MG tablet Take 1 tablet (800 mg total) by mouth 3 (three) times daily. Muscle relaxer 04/15/14   Linna Hoff, MD  naproxen (NAPROSYN) 500 MG tablet Take 1 tablet (500 mg total) by mouth 2 (two) times daily. 08/18/13   Renne Crigler, PA-C  oxyCODONE-acetaminophen (PERCOCET/ROXICET) 5-325 MG per tablet Take 1 tablet by mouth every 6 (six) hours as needed for severe pain. 01/15/15   Mark Baton, MD   Triage Vitals: BP 139/86 mmHg  Pulse 59  Temp(Src) 98.2 F (36.8 C) (Oral)  Resp 16  SpO2 96%  Physical Exam  Constitutional: He is oriented to person, place, and time. He appears well-developed and well-nourished. No distress.  HENT:  Head: Normocephalic and atraumatic.  Cardiovascular: Normal rate, regular rhythm and normal heart sounds.   No murmur heard. Pulmonary/Chest: Effort normal and breath sounds normal. No respiratory distress.  He has no wheezes. He exhibits tenderness.  Tenderness to palpation of the anterior chest wall and left lower chest  Abdominal: Soft. There is no tenderness.  Musculoskeletal: He exhibits no edema.  Neurological: He is alert and oriented to person, place, and time.  Skin: Skin is warm and dry.  Psychiatric: He has a normal mood and affect.  Nursing note and vitals reviewed.   ED Course  Procedures (including critical care time)  DIAGNOSTIC STUDIES: Oxygen  Saturation is 96% on room air, adeqaute by my interpretation.    COORDINATION OF CARE: At 2355 Discussed treatment plan with patient which includes labs. Patient agrees.   Labs Review Labs Reviewed  CBC  BASIC METABOLIC PANEL  BRAIN NATRIURETIC PEPTIDE  I-STAT TROPOININ, ED    Imaging Review Dg Chest 2 View  01/14/2015   CLINICAL DATA:  Left-sided chest pain beginning at work. Initial encounter.  EXAM: CHEST  2 VIEW  COMPARISON:  None.  FINDINGS: Normal heart size and mediastinal contours. No acute infiltrate or edema. No effusion or pneumothorax. No acute osseous findings.  IMPRESSION: Negative chest.   Electronically Signed   By: Marnee SpringJonathon  Watts M.D.   On: 01/14/2015 23:19     EKG Interpretation   Date/Time:  Wednesday Jan 14 2015 22:17:50 EDT Ventricular Rate:  62 PR Interval:  156 QRS Duration: 100 QT Interval:  416 QTC Calculation: 422 R Axis:   33 Text Interpretation:  Normal sinus rhythm Normal ECG Confirmed by HORTON   MD, COURTNEY ([id number]) on 01/14/2015 11:46:14 PM      MDM   Final diagnoses:  Musculoskeletal chest pain    Patient presents with chest pain. Reports chest pain onset following a forceful turn with his right arm. Chest pain is reproducible on exam. Low suspicion for ACS and patient is low risk. EKG is normal sinus rhythm without ischemia. No other associated symptoms. Lab work sent from triage reviewed and unremarkable including troponin. Chest x-ray shows no evidence of pneumothorax or pneumonia. Patient given Percocet and Toradol. Reports improvement of symptoms. Patient advised to take ibuprofen at home and will be given a short course of Norco for pain.  After history, exam, and medical workup I feel the patient has been appropriately medically screened and is safe for discharge home. Pertinent diagnoses were discussed with the patient. Patient was given return precautions.  I personally performed the services described in this documentation, which  was scribed in my presence. The recorded information has been reviewed and is accurate.    Mark Batonourtney F Horton, MD 01/15/15 [phone]

## 2015-01-14 NOTE — ED Notes (Signed)
MD at bedside. 

## 2015-01-14 NOTE — ED Notes (Signed)
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

## 2015-01-15 LAB — BRAIN NATRIURETIC PEPTIDE: B Natriuretic Peptide: 5.5 pg/mL (ref 0.0–100.0)

## 2015-01-15 MED ORDER — OXYCODONE-ACETAMINOPHEN 5-325 MG PO TABS: 1.0000 | ORAL_TABLET | Freq: Four times a day (QID) | ORAL | Status: DC | PRN

## 2015-01-15 MED ORDER — IBUPROFEN 600 MG PO TABS: 600.0000 mg | ORAL_TABLET | Freq: Four times a day (QID) | ORAL | Status: DC | PRN

## 2015-01-15 NOTE — Discharge Instructions (Signed)

## 2015-05-25 ENCOUNTER — Ambulatory Visit: Payer: Self-pay | Admitting: Internal Medicine

## 2015-10-30 IMAGING — CR DG SHOULDER 2+V*L*
3 series · 3 of 3 positions shown · non-contrast
Comparison: None

CLINICAL DATA: Diffuse left shoulder pain greatest superiorly, no
known injury

EXAM:
LEFT SHOULDER - 2+ VIEW

[w shoulder internal left]
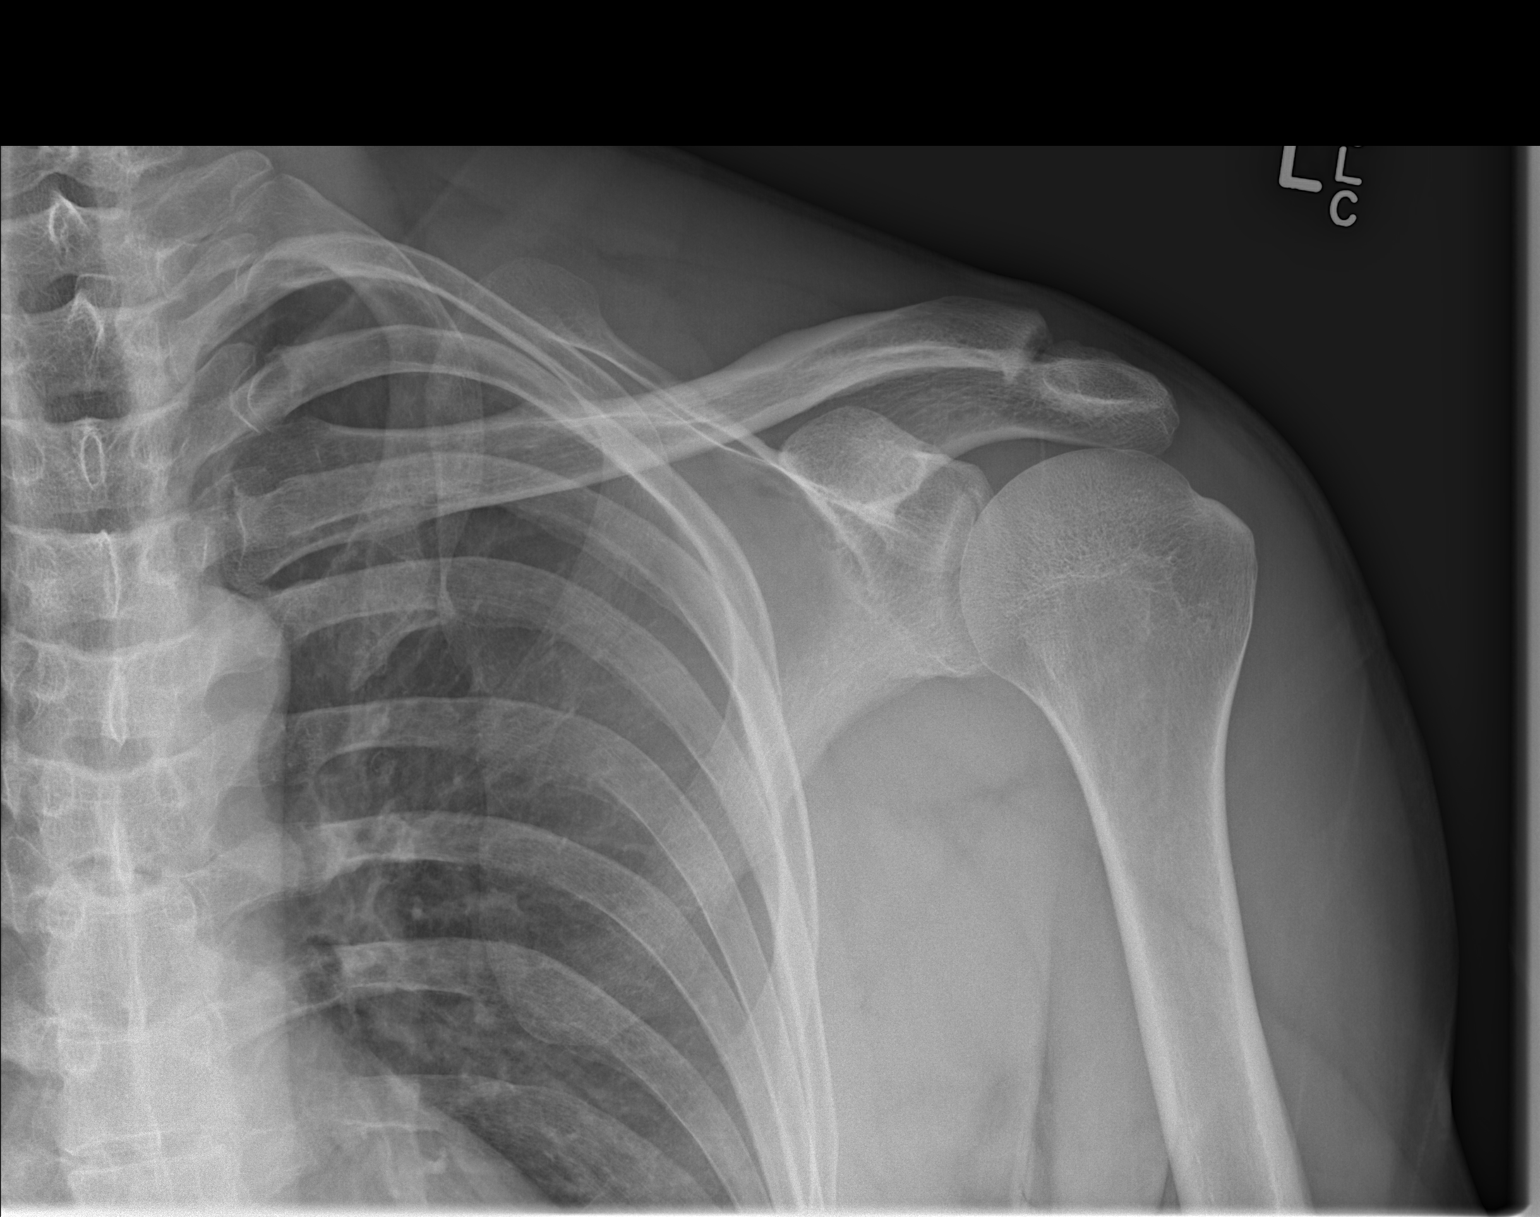

[w shoulder y-view left]
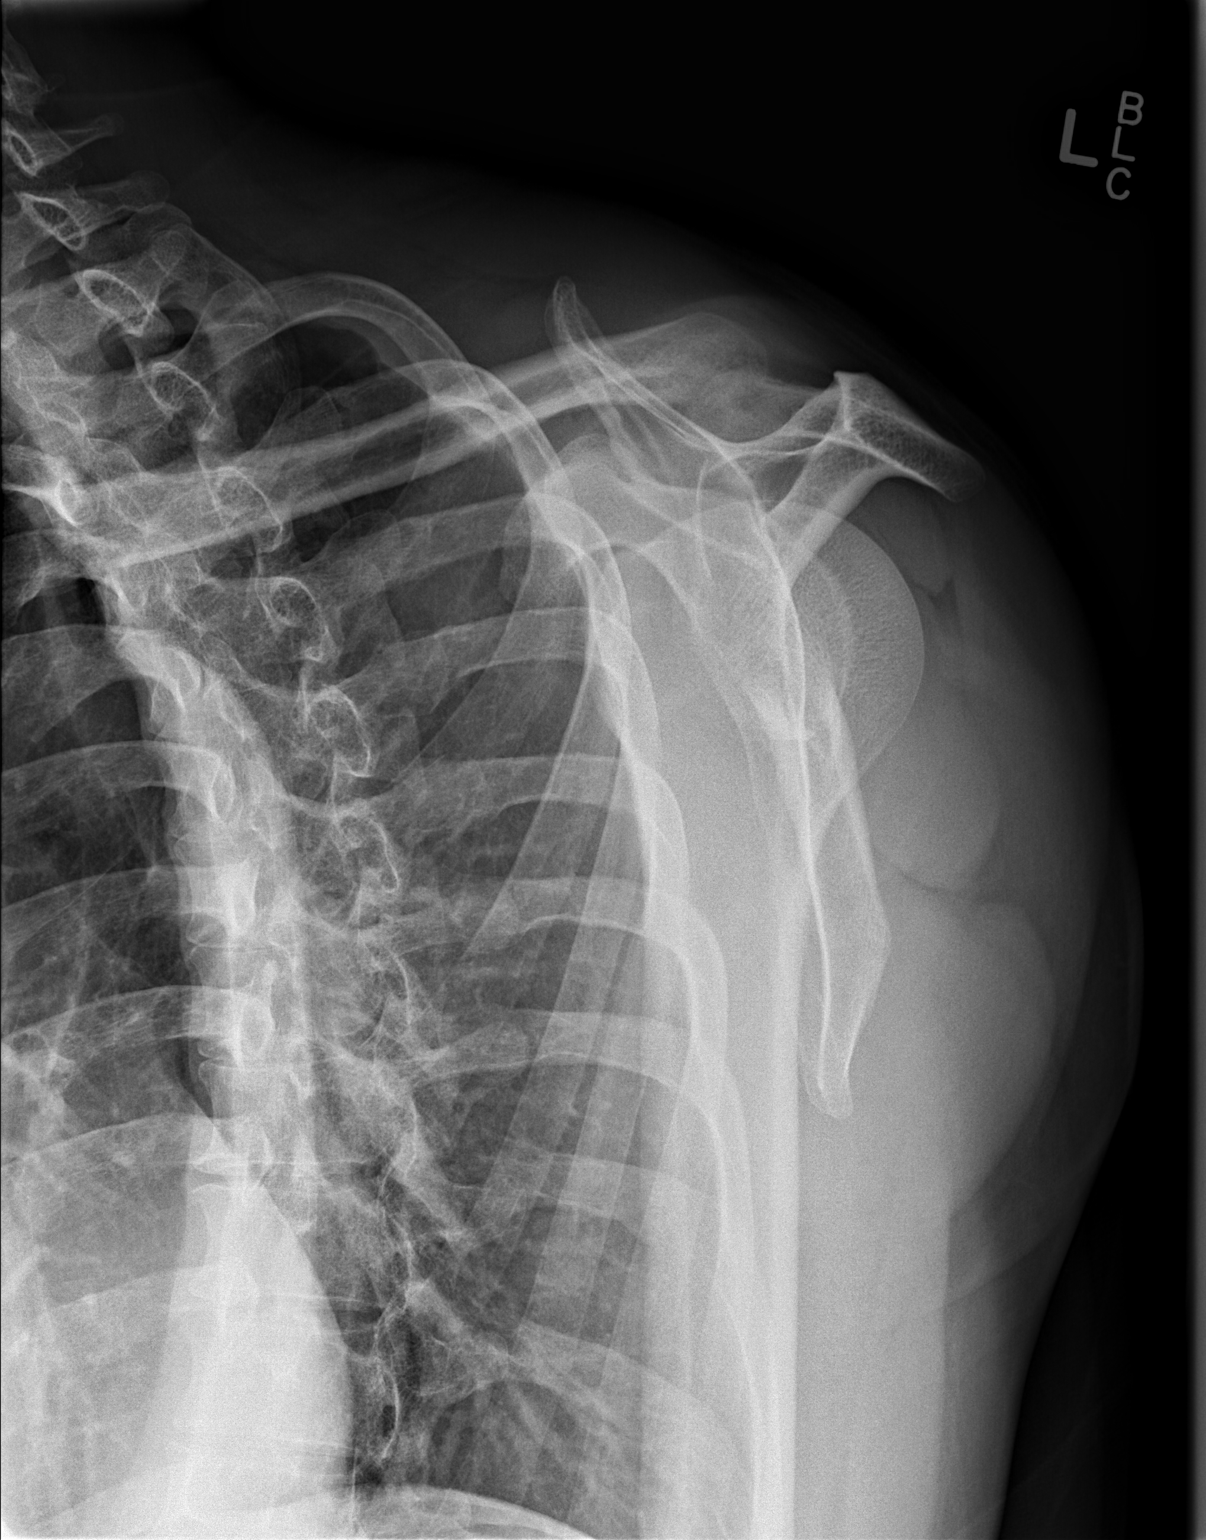

[x shoulder axillary left]
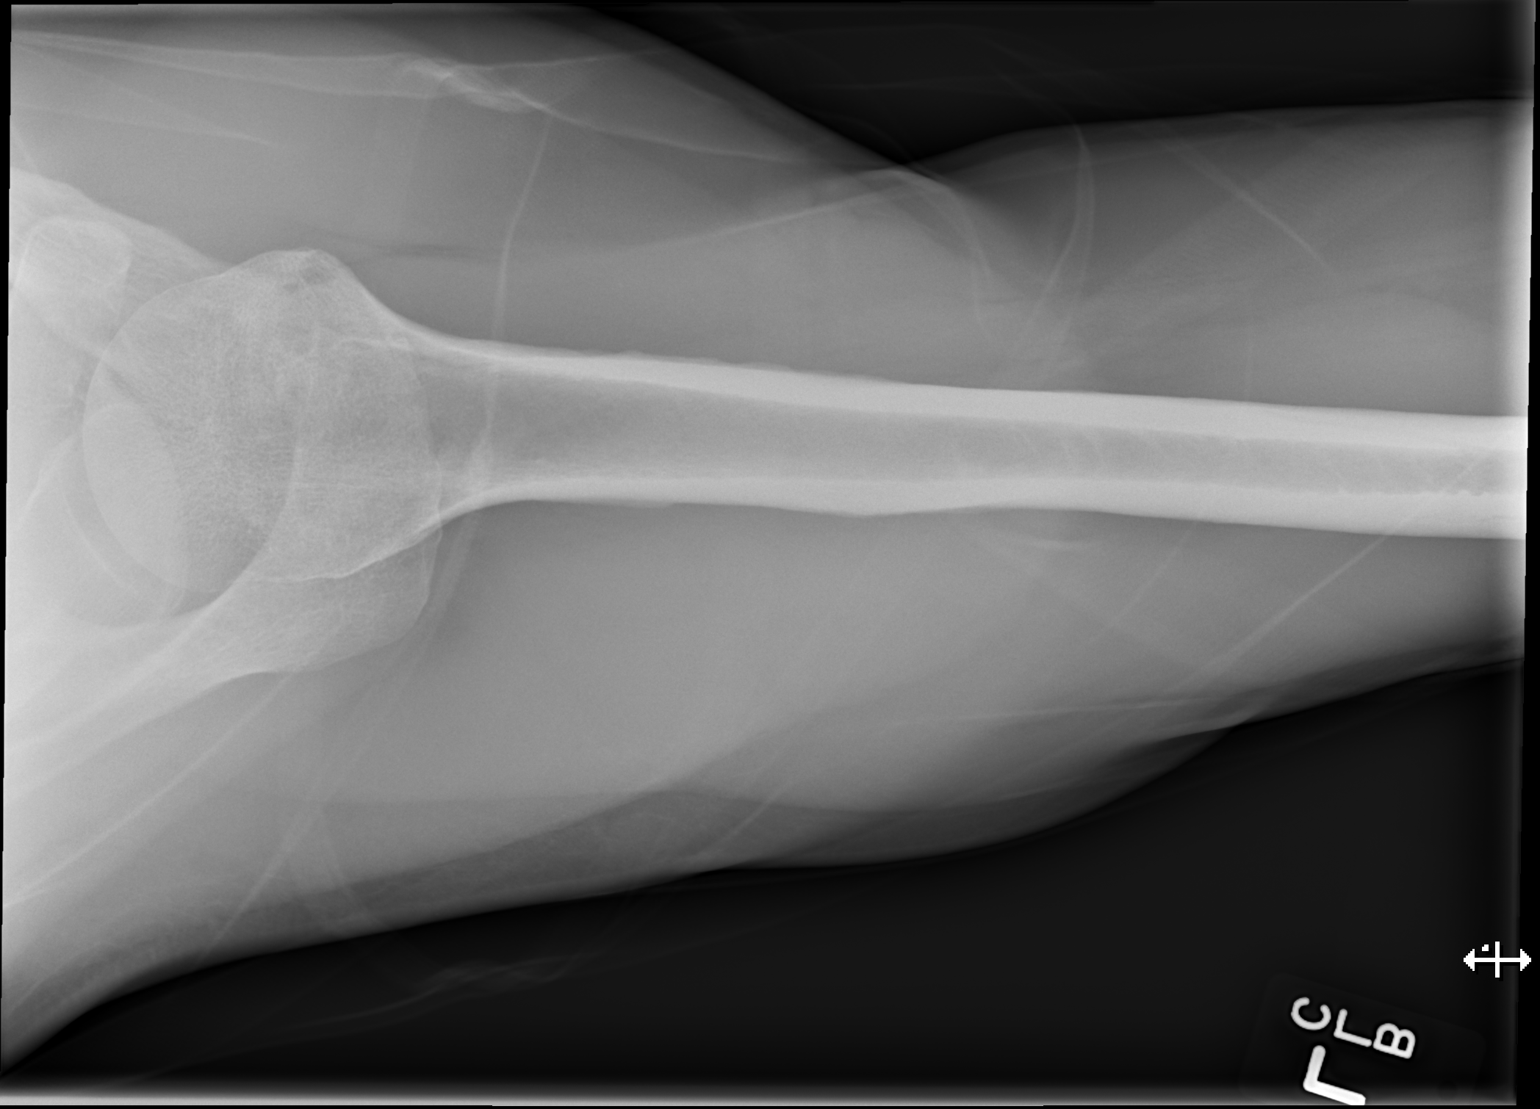

[3 of 3 positions shown; findings below may reference images not displayed]

FINDINGS: Osseous mineralization normal.

AC joint alignment normal.

No acute fracture, dislocation or bone destruction.

Visualized left ribs unremarkable.
IMPRESSION: No acute abnormalities.

## 2017-11-10 ENCOUNTER — Other Ambulatory Visit: Payer: Self-pay | Admitting: General Practice

## 2017-11-10 DIAGNOSIS — Z13220 Encounter for screening for lipoid disorders: Secondary | ICD-10-CM

## 2017-11-10 DIAGNOSIS — Z0000 Encounter for general adult medical examination without abnormal findings: Secondary | ICD-10-CM

## 2017-11-16 ENCOUNTER — Other Ambulatory Visit: Payer: Self-pay | Admitting: Internal Medicine

## 2017-11-16 ENCOUNTER — Telehealth: Payer: Self-pay | Admitting: Internal Medicine

## 2017-11-16 NOTE — Telephone Encounter (Signed)
Did not keep new pat lab appt. CPE has been scheduled next week. Need labs in advance. Message left on wife's voice mail. She did not come for her labs either.

## 2017-11-24 ENCOUNTER — Encounter: Payer: Self-pay | Admitting: Internal Medicine

## 2018-10-20 ENCOUNTER — Emergency Department (HOSPITAL_COMMUNITY)
Admission: EM | Admit: 2018-10-20 | Discharge: 2018-10-21 | Disposition: A | Discharge status: Home / Self Care | Payer: Managed Care, Other (non HMO) | Attending: Emergency Medicine | Admitting: Emergency Medicine

## 2018-10-20 ENCOUNTER — Encounter (HOSPITAL_COMMUNITY): Payer: Self-pay | Admitting: Emergency Medicine

## 2018-10-20 ENCOUNTER — Other Ambulatory Visit: Payer: Self-pay

## 2018-10-20 DIAGNOSIS — F17210 Nicotine dependence, cigarettes, uncomplicated: Secondary | ICD-10-CM | POA: Diagnosis not present

## 2018-10-20 DIAGNOSIS — E876 Hypokalemia: Secondary | ICD-10-CM | POA: Insufficient documentation

## 2018-10-20 DIAGNOSIS — I10 Essential (primary) hypertension: Secondary | ICD-10-CM | POA: Diagnosis present

## 2018-10-20 DIAGNOSIS — Z79899 Other long term (current) drug therapy: Secondary | ICD-10-CM | POA: Insufficient documentation

## 2018-10-20 HISTORY — DX: Essential (primary) hypertension: I10

## 2018-10-20 LAB — CBC WITH DIFFERENTIAL/PLATELET
Abs Immature Granulocytes: 0.01 10*3/uL (ref 0.00–0.07)
Basophils Absolute: 0.1 10*3/uL (ref 0.0–0.1)
Basophils Relative: 1 %
EOS ABS: 0.2 10*3/uL (ref 0.0–0.5)
Eosinophils Relative: 3 %
HCT: 45.9 % (ref 39.0–52.0)
Hemoglobin: 14.2 g/dL (ref 13.0–17.0)
Immature Granulocytes: 0 %
LYMPHS ABS: 2.1 10*3/uL (ref 0.7–4.0)
Lymphocytes Relative: 44 %
MCH: 27.6 pg (ref 26.0–34.0)
MCHC: 30.9 g/dL (ref 30.0–36.0)
MCV: 89.3 fL (ref 80.0–100.0)
Monocytes Absolute: 0.4 10*3/uL (ref 0.1–1.0)
Monocytes Relative: 8 %
Neutro Abs: 2.1 10*3/uL (ref 1.7–7.7)
Neutrophils Relative %: 44 %
Platelets: 297 10*3/uL (ref 150–400)
RBC: 5.14 MIL/uL (ref 4.22–5.81)
RDW: 13.9 % (ref 11.5–15.5)
WBC: 4.7 10*3/uL (ref 4.0–10.5)
nRBC: 0 % (ref 0.0–0.2)

## 2018-10-20 NOTE — ED Provider Notes (Signed)
[facility]-EMERGENCY DEPT Provider Note: [name], MD, FACEP  CSN: [id number] MRN: [id number] ARRIVAL: 10/20/18 at 2212 ROOM: WA20/[facility]20   CHIEF COMPLAINT  Hypertension   HISTORY OF PRESENT ILLNESS  10/20/18 11:14 PM Mark Fuller is a 48 y.o. male with a history of hypertension.  His blood pressure has been elevated for about the past month.  He was started on "a little blue pill" about a month ago but this did not adequately control his blood pressure.  Earlier this week he was switched to losartan 25 mg daily.  He has found that his blood pressure has been increasing since starting the losartan.  He states it is been as high as 187/107 at home and his blood pressure on arrival here was 177/115.  He states his head "feels funny, but I cannot exactly describe how it feels because have never felt this way before".  He denies blurred vision, focal numbness or weakness, chest pain or shortness of breath.    Past Medical History:  Diagnosis Date  . Back pain   . Hypertension   . Lumbar herniated disc     History reviewed. No pertinent surgical history.  History reviewed. No pertinent family history.  Social History   Tobacco Use  . Smoking status: Current Some Day Smoker    Types: Cigarettes  . Smokeless tobacco: Never Used  Substance Use Topics  . Alcohol use: Yes    Comment: occ  . Drug use: No    Prior to Admission medications   Medication Sig Start Date End Date Taking? Authorizing Provider  Aspirin-Salicylamide-Caffeine (ARTHRITIS STRENGTH BC POWDER PO) Take 1 each by mouth daily as needed (pain).    [provider]  diclofenac sodium (VOLTAREN) 1 % GEL Apply 4 g topically 4 (four) times daily. To left shoulder 09/22/13   Linna Hoff, MD  HYDROcodone-acetaminophen (NORCO/VICODIN) 5-325 MG per tablet Take 1-2 tablets every 6 hours as needed for severe pain 08/18/13   Renne Crigler, PA-C  ibuprofen (ADVIL,MOTRIN) 600 MG tablet Take 1 tablet (600 mg total) by  mouth every 6 (six) hours as needed. 01/15/15   Horton, Mayer Masker, MD  metaxalone (SKELAXIN) 800 MG tablet Take 1 tablet (800 mg total) by mouth 3 (three) times daily. Muscle relaxer 04/15/14   Linna Hoff, MD  naproxen (NAPROSYN) 500 MG tablet Take 1 tablet (500 mg total) by mouth 2 (two) times daily. 08/18/13   Renne Crigler, PA-C  oxyCODONE-acetaminophen (PERCOCET/ROXICET) 5-325 MG per tablet Take 1 tablet by mouth every 6 (six) hours as needed for severe pain. 01/15/15   Horton, Mayer Masker, MD    Allergies Patient has no known allergies.   REVIEW OF SYSTEMS  Negative except as noted here or in the History of Present Illness.   PHYSICAL EXAMINATION  Initial Vital Signs Blood pressure (!) 187/112, pulse 69, temperature 98.3 F (36.8 C), temperature source Oral, resp. rate 20, height 6\' 2"  (1.88 m), weight 93 kg, SpO2 99 %.  Examination General: Well-developed, well-nourished male in no acute distress; appearance consistent with age of record HENT: normocephalic; atraumatic Eyes: pupils equal, round and reactive to light; extraocular muscles intact Neck: supple Heart: regular rate and rhythm Lungs: clear to auscultation bilaterally Abdomen: soft; nondistended; nontender; bowel sounds present Extremities: No deformity; full range of motion; pulses normal Neurologic: Awake, alert and oriented; motor function intact in all extremities and symmetric; no facial droop Skin: Warm and dry Psychiatric: Flat affect   RESULTS  Summary of  this visit's results, reviewed by myself:   EKG Interpretation  Date/Time:    Ventricular Rate:    PR Interval:    QRS Duration:   QT Interval:    QTC Calculation:   R Axis:     Text Interpretation:        Laboratory Studies: Results for orders placed or performed during the hospital encounter of 10/20/18 (from the past 24 hour(s))  Basic metabolic panel     Status: Abnormal   Collection Time: 10/20/18 11:00 PM  Result Value Ref Range    Sodium 140 135 - 145 mmol/L   Potassium 3.3 (L) 3.5 - 5.1 mmol/L   Chloride 107 98 - 111 mmol/L   CO2 25 22 - 32 mmol/L   Glucose, Bld 91 70 - 99 mg/dL   BUN 12 6 - 20 mg/dL   Creatinine, Ser 1.42 0.61 - 1.24 mg/dL   Calcium 8.9 8.9 - 39.5 mg/dL   GFR calc non Af Amer >60 >60 mL/min   GFR calc Af Amer >60 >60 mL/min   Anion gap 8 5 - 15  CBC with Differential/Platelet     Status: None   Collection Time: 10/20/18 11:00 PM  Result Value Ref Range   WBC 4.7 4.0 - 10.5 K/uL   RBC 5.14 4.22 - 5.81 MIL/uL   Hemoglobin 14.2 13.0 - 17.0 g/dL   HCT 32.0 23.3 - 43.5 %   MCV 89.3 80.0 - 100.0 fL   MCH 27.6 26.0 - 34.0 pg   MCHC 30.9 30.0 - 36.0 g/dL   RDW 68.6 16.8 - 37.2 %   Platelets 297 150 - 400 K/uL   nRBC 0.0 0.0 - 0.2 %   Neutrophils Relative % 44 %   Neutro Abs 2.1 1.7 - 7.7 K/uL   Lymphocytes Relative 44 %   Lymphs Abs 2.1 0.7 - 4.0 K/uL   Monocytes Relative 8 %   Monocytes Absolute 0.4 0.1 - 1.0 K/uL   Eosinophils Relative 3 %   Eosinophils Absolute 0.2 0.0 - 0.5 K/uL   Basophils Relative 1 %   Basophils Absolute 0.1 0.0 - 0.1 K/uL   Immature Granulocytes 0 %   Abs Immature Granulocytes 0.01 0.00 - 0.07 K/uL   Imaging Studies: No results found.  ED COURSE and MDM  Nursing notes and initial vitals signs, including pulse oximetry, reviewed.  Vitals:   10/21/18 0004 10/21/18 0015 10/21/18 0030 10/21/18 0031  BP:  (!) 155/113 (!) 168/121   Pulse: 70 68 69 68  Resp: 14 19 14  (!) 21  Temp:      TempSrc:      SpO2: 99% 98% 100% 99%  Weight:      Height:       There is no evidence of endorgan damage on exam or lab work.  We will start him on Norvasc and refer him back to his PCP.  He was advised that we do not like to rapidly reduce blood pressure without evidence of endorgan damage.  We will give a dose of K-Dur in the ED for mild hypokalemia.  PROCEDURES    ED DIAGNOSES     ICD-10-CM   1. Hypertension not at goal I10   2. Hypokalemia E87.6        Dorice Stiggers,  Jonny Ruiz, MD 10/21/18 [phone]

## 2018-10-20 NOTE — ED Triage Notes (Signed)
Patient complaining of blood pressure keeps elevating and the medication is not helping. Patient states he doesn't feel right. He feels weird. Patient change blood medication on Monday by md.

## 2018-10-21 LAB — BASIC METABOLIC PANEL
Anion gap: 8 (ref 5–15)
BUN: 12 mg/dL (ref 6–20)
CO2: 25 mmol/L (ref 22–32)
Calcium: 8.9 mg/dL (ref 8.9–10.3)
Chloride: 107 mmol/L (ref 98–111)
Creatinine, Ser: 1.11 mg/dL (ref 0.61–1.24)
GFR calc Af Amer: >60 mL/min (ref >60)
GFR calc non Af Amer: >60 mL/min (ref >60)
GLUCOSE: 91 mg/dL (ref 70–99)
Potassium: 3.3 mmol/L — ABNORMAL LOW (ref 3.5–5.1)
Sodium: 140 mmol/L (ref 135–145)

## 2018-10-21 MED ORDER — KETOROLAC TROMETHAMINE 15 MG/ML IJ SOLN
15.0000 mg | Freq: Once | INTRAMUSCULAR | Status: AC
  Administered 2018-10-21: 15 mg via INTRAVENOUS
  Filled 2018-10-21: qty 1

## 2018-10-21 MED ORDER — AMLODIPINE BESYLATE 5 MG PO TABS
5.0000 mg | ORAL_TABLET | Freq: Once | ORAL | Status: AC
  Administered 2018-10-21: 5 mg via ORAL
  Filled 2018-10-21: qty 1

## 2018-10-21 MED ORDER — AMLODIPINE BESYLATE 5 MG PO TABS: 5.0000 mg | ORAL_TABLET | Freq: Every day | ORAL | 0 refills | Status: AC

## 2018-10-21 MED ORDER — POTASSIUM CHLORIDE CRYS ER 20 MEQ PO TBCR
40.0000 meq | EXTENDED_RELEASE_TABLET | Freq: Once | ORAL | Status: AC
  Administered 2018-10-21: 40 meq via ORAL
  Filled 2018-10-21: qty 2

## 2018-11-13 ENCOUNTER — Institutional Professional Consult (permissible substitution): Payer: Self-pay | Admitting: Pulmonary Disease

## 2019-03-18 ENCOUNTER — Institutional Professional Consult (permissible substitution): Payer: Managed Care, Other (non HMO) | Admitting: Internal Medicine

## 2019-03-18 ENCOUNTER — Institutional Professional Consult (permissible substitution): Payer: Self-pay | Admitting: Pulmonary Disease

## 2019-04-03 ENCOUNTER — Telehealth: Payer: Self-pay | Admitting: Pulmonary Disease

## 2019-04-04 NOTE — Telephone Encounter (Signed)
Seems like encounter was open in error so closing encounter.  

## 2019-04-08 ENCOUNTER — Ambulatory Visit (INDEPENDENT_AMBULATORY_CARE_PROVIDER_SITE_OTHER): Payer: Managed Care, Other (non HMO) | Admitting: Internal Medicine

## 2019-04-08 ENCOUNTER — Encounter: Payer: Self-pay | Admitting: Internal Medicine

## 2019-04-08 ENCOUNTER — Other Ambulatory Visit: Payer: Self-pay

## 2019-04-08 DIAGNOSIS — F5101 Primary insomnia: Secondary | ICD-10-CM

## 2019-04-08 DIAGNOSIS — G4726 Circadian rhythm sleep disorder, shift work type: Secondary | ICD-10-CM | POA: Diagnosis not present

## 2019-04-08 DIAGNOSIS — G4700 Insomnia, unspecified: Secondary | ICD-10-CM | POA: Insufficient documentation

## 2019-04-08 DIAGNOSIS — K219 Gastro-esophageal reflux disease without esophagitis: Secondary | ICD-10-CM

## 2019-04-08 MED ORDER — ZALEPLON 10 MG PO CAPS: 10.0000 mg | ORAL_CAPSULE | Freq: Every evening | ORAL | 2 refills | Status: DC | PRN

## 2019-04-08 NOTE — Patient Instructions (Addendum)
Booklet given on shift work sleep problems  Script sent for zaleplon/ Sonata to help sleep. Try taking this maybe 30 minutes before bed. You can repeat it once, if needed, if you still intend to sleep another 3-4 hours.  Ask your wife if you are snoring loudly or seem to stop breathing in your sleep

## 2019-04-08 NOTE — Assessment & Plan Note (Signed)
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 Dscussed,For now we will try a short half-life sleep aid to see how he responds. Plan- Sonata once or twice for sleep.

## 2019-04-08 NOTE — Assessment & Plan Note (Signed)
Based on his reported symptoms. Managed by PCP

## 2019-04-08 NOTE — Progress Notes (Signed)
04/08/2019- 89 yoM married current smoker for sleep evaluation, referred by Dr. 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 No option to change shift or change job. Especially in the last few years he has been having more trouble with sleep. Fighting sleepiness by 4AM, taking coffee, caffeine tabs, energy drinks.  He goes to bed about 5 PM, up around 11PM. Often too wired to sleep comfortably. Has been having nightmares, told that he is kicking and jerking in sleep. Only snores if really tired. Not punching or sleep walking. Ambien caused nausea; antidepressant carried over with tiredness into work time. Denies lung, heart problems or seizure. No ENT surgery but told septal deviation. Not strictly protecting sleep environment. Family may intrude. Not black out.  Prior to Admission medications   Medication Sig Start Date End Date Taking? Authorizing Provider  amLODipine (NORVASC) 5 MG tablet Take 1 tablet (5 mg total) by mouth daily. 10/21/18  Yes Molpus, John, MD  Aspirin-Salicylamide-Caffeine (ARTHRITIS STRENGTH BC POWDER PO) Take 1 each by mouth daily as needed (pain).   Yes [provider]  zaleplon (SONATA) 10 MG capsule Take 1 capsule (10 mg total) by mouth at bedtime as needed for sleep. 04/08/19   Deneise Lever, MD   Past Medical History:  Diagnosis Date  . Back pain   . Hypertension   . Lumbar herniated disc    History reviewed. No pertinent surgical history. History reviewed. No pertinent family history. Social History   Socioeconomic History  . Marital status: Married    Spouse name: Not on file  . Number of children: Not on file  . Years of education: Not on file  . Highest education level: Not on file  Occupational History  . Not on file  Social Needs  . Financial resource strain: Not on file  . Food insecurity    Worry:  Not on file    Inability: Not on file  . Transportation needs    Medical: Not on file    Non-medical: Not on file  Tobacco Use  . Smoking status: Current Some Day Smoker    Types: Cigarettes  . Smokeless tobacco: Never Used  . Tobacco comment: 04/08/2019 pt reports smoking 1 cig if he "gets stressed out"  Substance and Sexual Activity  . Alcohol use: Yes    Comment: occ  . Drug use: No  . Sexual activity: Not on file  Lifestyle  . Physical activity    Days per week: Not on file    Minutes per session: Not on file  . Stress: Not on file  Relationships  . Social Herbalist on phone: Not on file    Gets together: Not on file    Attends religious service: Not on file    Active member of club or organization: Not on file    Attends meetings of clubs or organizations: Not on file    Relationship status: Not on file  . Intimate partner violence    Fear of current or ex partner: Not on file    Emotionally abused: Not on file    Physically abused: Not on file    Forced sexual activity: Not on file  Other Topics Concern  . Not on file  Social History Narrative  . Not on file   ROS-see HPI   + =  positive Constitutional:    weight loss, night sweats, fevers, chills, fatigue, lassitude. HEENT:    headaches, difficulty swallowing, tooth/dental problems, sore throat,       sneezing, itching, ear ache, nasal congestion, post nasal drip, snoring CV:    chest pain, orthopnea, PND, swelling in lower extremities, anasarca,                                  dizziness, palpitations Resp:   shortness of breath with exertion or at rest.                productive cough,   non-productive cough, coughing up of blood.              change in color of mucus.  wheezing.   Skin:    rash or lesions. GI:  +  heartburn, indigestion, abdominal pain, nausea, vomiting, diarrhea,                 change in bowel habits, loss of appetite GU: dysuria, change in color of urine, no urgency or  frequency.   flank pain. MS:   joint pain, stiffness, decreased range of motion, back pain. Neuro-     nothing unusual Psych:  change in mood or affect.  +depression or +anxiety.   memory loss.  OBJ- Physical Exam General- Alert, Oriented, Affect-appropriate, Distress- none acute Skin- rash-none, lesions- none, excoriation- none Lymphadenopathy- none Head- atraumatic            Eyes- Gross vision intact, PERRLA, conjunctivae and secretions clear            Ears- Hearing, canals-normal            Nose- Clear, no-Septal dev, mucus, polyps, erosion, perforation             Throat- Mallampati II-III , mucosa clear , drainage- none, tonsils- atrophic Neck- flexible , trachea midline, no stridor , thyroid nl, carotid no bruit Chest - symmetrical excursion , unlabored           Heart/CV- RRR , no murmur , no gallop  , no rub, nl s1 s2                           - JVD- none , edema- none, stasis changes- none, varices- none           Lung- clear to P&A, wheeze- none, cough- none , dullness-none, rub- none           Chest wall-  Abd-  Br/ Gen/ Rectal- Not done, not indicated Extrem- cyanosis- none, clubbing, none, atrophy- none, strength- nl Neuro- grossly intact to observation

## 2019-04-08 NOTE — Assessment & Plan Note (Signed)
Not clear why he is having more trouble in recent years, possibly aging, but this is making him anxious and depressed. Heavy caffeine is affecting his ability to sleep when he gets home. We will watch for OSA and REM Behavior Disorder, but right now it is not evident that a sleep study would help.  Education done and pamphlet given emphasizing better sleep hygiene.

## 2019-07-16 ENCOUNTER — Ambulatory Visit: Payer: Managed Care, Other (non HMO) | Admitting: Internal Medicine

## 2019-08-10 ENCOUNTER — Other Ambulatory Visit: Payer: Self-pay | Admitting: Internal Medicine

## 2019-08-13 NOTE — Telephone Encounter (Signed)
I have e-sent refill for zaleplon this time. I will not fill any more prescriptions for him until he is seen again in our office by me or NP.

## 2019-08-13 NOTE — Telephone Encounter (Signed)
Med: Zaleplon 10mg  Last OV: 07/16/2019 with Dr. Annamaria Boots No showed Next OV: Nothing pending Last filled: 04/08/2019 #30 with 2 refills Filled by: Dr. Annamaria Boots  Dr. Annamaria Boots please advise on refill  No Known Allergies   Current Outpatient Medications on File Prior to Visit  Medication Sig Dispense Refill  . amLODipine (NORVASC) 5 MG tablet Take 1 tablet (5 mg total) by mouth daily. 30 tablet 0  . Aspirin-Salicylamide-Caffeine (ARTHRITIS STRENGTH BC POWDER PO) Take 1 each by mouth daily as needed (pain).    . zaleplon (SONATA) 10 MG capsule Take 1 capsule (10 mg total) by mouth at bedtime as needed for sleep. 30 capsule 2   No current facility-administered medications on file prior to visit.

## 2019-10-25 ENCOUNTER — Other Ambulatory Visit: Payer: Self-pay | Admitting: Internal Medicine

## 2019-10-25 MED ORDER — ZALEPLON 10 MG PO CAPS: 10.0000 mg | ORAL_CAPSULE | Freq: Every evening | ORAL | 5 refills | Status: DC | PRN

## 2020-03-19 ENCOUNTER — Encounter (INDEPENDENT_AMBULATORY_CARE_PROVIDER_SITE_OTHER): Payer: 59 | Admitting: Podiatry

## 2020-03-19 ENCOUNTER — Ambulatory Visit: Payer: Managed Care, Other (non HMO)

## 2020-03-19 DIAGNOSIS — M778 Other enthesopathies, not elsewhere classified: Secondary | ICD-10-CM

## 2020-03-19 NOTE — Progress Notes (Signed)
This encounter was created in error - please disregard.

## 2020-04-01 ENCOUNTER — Ambulatory Visit: Payer: Managed Care, Other (non HMO) | Admitting: Podiatry

## 2020-07-06 ENCOUNTER — Other Ambulatory Visit: Payer: Self-pay | Admitting: Orthopedic Surgery

## 2020-07-06 DIAGNOSIS — M542 Cervicalgia: Secondary | ICD-10-CM

## 2020-08-05 ENCOUNTER — Other Ambulatory Visit: Payer: Self-pay | Admitting: Orthopedic Surgery

## 2020-08-31 ENCOUNTER — Other Ambulatory Visit (HOSPITAL_COMMUNITY): Payer: Managed Care, Other (non HMO)

## 2020-08-31 ENCOUNTER — Inpatient Hospital Stay (HOSPITAL_COMMUNITY): Admission: RE | Admit: 2020-08-31 | Payer: Managed Care, Other (non HMO) | Source: Ambulatory Visit

## 2020-09-02 ENCOUNTER — Encounter (HOSPITAL_COMMUNITY): Admission: RE | Payer: Self-pay | Source: Home / Self Care

## 2020-09-02 ENCOUNTER — Ambulatory Visit (HOSPITAL_COMMUNITY)
Admission: RE | Admit: 2020-09-02 | Payer: Managed Care, Other (non HMO) | Source: Home / Self Care | Admitting: Orthopedic Surgery

## 2020-09-02 SURGERY — ANTERIOR CERVICAL DECOMPRESSION/DISCECTOMY FUSION 2 LEVELS
Anesthesia: General

## 2021-08-12 ENCOUNTER — Ambulatory Visit (INDEPENDENT_AMBULATORY_CARE_PROVIDER_SITE_OTHER): Payer: Self-pay | Admitting: Student

## 2021-08-12 DIAGNOSIS — J111 Influenza due to unidentified influenza virus with other respiratory manifestations: Secondary | ICD-10-CM | POA: Insufficient documentation

## 2021-08-12 DIAGNOSIS — I10 Essential (primary) hypertension: Secondary | ICD-10-CM

## 2021-08-12 NOTE — Progress Notes (Signed)
°  [facility] Medicine Res[facility] Telephone Encounter Continuity Care Appointment  HPI:  This telephone encounter was created for [name] on 08/12/2021 for the following purpose/cc flu symptoms.   Past Medical History:  Past Medical History:  Diagnosis Date   Back pain    Hypertension    Lumbar herniated disc      ROS:  Positive: Coughing, sore throat, headache, runny nose Negative: SOB, chest pan, nausea, vomiting, diarrhea, loss of taste or smell   Assessment / Plan / Recommendations:  Please see A&P under problem oriented charting for assessment of the patient's acute and chronic medical conditions.  As always, pt is advised that if symptoms worsen or new symptoms arise, they should go to an urgent care facility or to to ER for further evaluation.   Consent and Medical Decision Making:  Patient discussed with Dr.  Sol Blazing This is a telephone encounter between Mark Fuller and Doran Stabler on 08/12/2021 for flu symptoms. The visit was conducted with the patient located at home and Doran Stabler at [facility]. The patient's identity was confirmed using their DOB and current address. The patient has consented to being evaluated through a telephone encounter and understands the associated risks (an examination cannot be done and the patient may need to come in for an appointment) / benefits (allows the patient to remain at home, decreasing exposure to coronavirus). I personally spent 12 minutes on medical discussion.

## 2021-08-12 NOTE — Assessment & Plan Note (Signed)
Patient originally had an appointment with me today for establishing PCP.  He however developed headache, sore throat, runny nose, dry cough started yesterday afternoon.  He denies chest pain, shortness of breath, nausea, vomiting, diarrhea, loss of taste or smell.  His wife does not have similar symptoms.  He never had the flu shot or COVID vaccines.  He is taking DayQuil/NyQuil and ibuprofen as needed.  He also trying natural remedies like honey, orange juice or pineapple juice.  Symptoms are consistent with influenza or COVID.  I advised patient to obtain a home antigen COVID test.  If he is positive for COVID, he needs to be quarantined.  -Continue ibuprofen as needed for headache -He can also try Cepacol or Robitussin for sore throat and cough. -Given his uncontrolled hypertension, advised patient to avoid Sudafed or dextromethorphan. -He will call us back if symptoms do not get better.

## 2021-08-12 NOTE — Assessment & Plan Note (Signed)
Patient is taking amlodipine 5 mg with metoprolol 50 mg twice daily.  These medications are prescribed by Dr. Norva Riffle at [facility].  States that his systolic blood pressures usually in the 150s.  Said that he is leaving [facility] to establish PCP with [place].  -Will reassess his blood pressure at follow-up visit.

## 2021-08-24 NOTE — Progress Notes (Signed)
Internal Medicine Clinic Attending  Case discussed with Dr. Cyndie Chime  At the time of the visit.  We reviewed the residents history and exam and pertinent patient test results.  I agree with the assessment, diagnosis, and plan of care documented in the residents note. At follow-up visit, would discuss avoidance of frequent NSAIDs in the setting of HTN.

## 2021-08-26 ENCOUNTER — Encounter: Payer: Managed Care, Other (non HMO) | Admitting: Student

## 2024-03-06 ENCOUNTER — Encounter (INDEPENDENT_AMBULATORY_CARE_PROVIDER_SITE_OTHER): Payer: Self-pay | Admitting: *Deleted

## 2024-09-09 ENCOUNTER — Encounter (INDEPENDENT_AMBULATORY_CARE_PROVIDER_SITE_OTHER): Payer: Self-pay | Admitting: *Deleted
